# Patient Record
Sex: Female | Born: 1990 | Race: Black or African American | Hispanic: No | Marital: Single | Smoking: Never smoker
Health system: Southern US, Community
[De-identification: ages and names within clinical notes are randomized; demographics above are authoritative.]

## PROBLEM LIST (undated history)

## (undated) ENCOUNTER — Inpatient Hospital Stay (HOSPITAL_COMMUNITY): Payer: Self-pay

---

## 2016-12-24 ENCOUNTER — Emergency Department (HOSPITAL_BASED_OUTPATIENT_CLINIC_OR_DEPARTMENT_OTHER)
Admission: EM | Admit: 2016-12-24 | Discharge: 2016-12-24 | Disposition: A | Discharge status: Home / Self Care | Payer: BLUE CROSS/BLUE SHIELD | Attending: Emergency Medicine | Admitting: Emergency Medicine

## 2016-12-24 ENCOUNTER — Encounter (HOSPITAL_BASED_OUTPATIENT_CLINIC_OR_DEPARTMENT_OTHER): Payer: Self-pay | Admitting: Emergency Medicine

## 2016-12-24 DIAGNOSIS — J209 Acute bronchitis, unspecified: Secondary | ICD-10-CM

## 2016-12-24 DIAGNOSIS — R05 Cough: Secondary | ICD-10-CM | POA: Diagnosis present

## 2016-12-24 MED ORDER — ALBUTEROL SULFATE HFA 108 (90 BASE) MCG/ACT IN AERS: 2.0000 | INHALATION_SPRAY | RESPIRATORY_TRACT | 0 refills | Status: DC | PRN

## 2016-12-24 MED ORDER — AZITHROMYCIN 250 MG PO TABS: 250.0000 mg | ORAL_TABLET | Freq: Every day | ORAL | 0 refills | Status: DC

## 2016-12-24 MED FILL — VENTOLIN HFA 90 MCG INHALER: 108 (90 BAS | 17 days supply | Qty: 18 | Fill #0

## 2016-12-24 MED FILL — AZITHROMYCIN 250 MG TABLET: 250 | 5 days supply | Qty: 6 | Fill #0

## 2016-12-24 NOTE — ED Triage Notes (Signed)
Patient states she has been feeling bad x 2 weeks. States work told her she had to come due to cough.  Reports cough as nonproductive, reports shortness of breath on exertion.

## 2016-12-24 NOTE — ED Provider Notes (Signed)
[facility]   CSN: [id number] Arrival date & time: 12/24/16  4098     History   Chief Complaint Chief Complaint  Patient presents with  . Cough    HPI Joann Rivera is a 26 y.o. female.  The history is provided by the patient. No language interpreter was used.  Cough  This is a new problem. The problem occurs constantly. The problem has been gradually worsening. The cough is productive of sputum. There has been no fever. Pertinent negatives include no shortness of breath. She has tried nothing for the symptoms. The treatment provided no relief. She is not a smoker.  Pt complains of a cough and congestion.    History reviewed. No pertinent past medical history.  There are no active problems to display for this patient.   History reviewed. No pertinent surgical history.  OB History    No data available       Home Medications    Prior to Admission medications   Medication Sig Start Date End Date Taking? Authorizing Provider  albuterol (PROVENTIL HFA;VENTOLIN HFA) 108 (90 Base) MCG/ACT inhaler Inhale 2 puffs into the lungs every 4 (four) hours as needed for wheezing or shortness of breath. 12/24/16   Elson Areas, PA-C  azithromycin (ZITHROMAX) 250 MG tablet Take 1 tablet (250 mg total) by mouth daily. Take first 2 tablets together, then 1 every day until finished. 12/24/16   Elson Areas, PA-C    Family History History reviewed. No pertinent family history.  Social History Social History  Substance Use Topics  . Smoking status: Never Smoker  . Smokeless tobacco: Never Used  . Alcohol use Yes     Comment: occ     Allergies   Patient has no known allergies.   Review of Systems Review of Systems  Respiratory: Positive for cough. Negative for shortness of breath.   All other systems reviewed and are negative.    Physical Exam Updated Vital Signs Pulse 88   Temp 98.2 F (36.8 C) (Oral)   Resp 16   Ht 5\' 9"  (1.753 m)   Wt  79.4 kg   LMP 12/24/2016 (Exact Date)   SpO2 99%   BMI 25.84 kg/m   Physical Exam  Constitutional: She is oriented to person, place, and time. She appears well-developed and well-nourished.  HENT:  Head: Normocephalic.  Eyes: EOM are normal.  Neck: Normal range of motion.  Cardiovascular: Normal rate and regular rhythm.   Pulmonary/Chest: Effort normal and breath sounds normal.  Abdominal: Soft. She exhibits no distension.  Musculoskeletal: Normal range of motion.  Neurological: She is alert and oriented to person, place, and time.  Psychiatric: She has a normal mood and affect.  Nursing note and vitals reviewed.    ED Treatments / Results  Labs (all labs ordered are listed, but only abnormal results are displayed) Labs Reviewed - No data to display  EKG  EKG Interpretation None       Radiology No results found.  Procedures Procedures (including critical care time)  Medications Ordered in ED Medications - No data to display  Pt declined xray  Initial Impression / Assessment and Plan / ED Course  I have reviewed the triage vital signs and the nursing notes.  Pertinent labs & imaging results that were available during my care of the patient were reviewed by me and considered in my medical decision making (see chart for details).  Clinical Course  Final Clinical Impressions(s) / ED Diagnoses   Final diagnoses:  Acute bronchitis, unspecified organism    New Prescriptions Discharge Medication List as of 12/24/2016 10:42 AM    START taking these medications   Details  albuterol (PROVENTIL HFA;VENTOLIN HFA) 108 (90 Base) MCG/ACT inhaler Inhale 2 puffs into the lungs every 4 (four) hours as needed for wheezing or shortness of breath., Starting Mon 12/24/2016, Print    azithromycin (ZITHROMAX) 250 MG tablet Take 1 tablet (250 mg total) by mouth daily. Take first 2 tablets together, then 1 every day until finished., Starting Mon 12/24/2016, Print      An  After Visit Summary was printed and given to the patient.   Lonia SkinnerLeslie K ErhardSofia, PA-C 12/24/16 1519    Rolan BuccoMelanie Belfi, MD 12/24/16 1520

## 2016-12-24 NOTE — Discharge Instructions (Signed)
Return if any problems.  Recheck if not improving in 3-4 days

## 2017-06-10 ENCOUNTER — Encounter (HOSPITAL_BASED_OUTPATIENT_CLINIC_OR_DEPARTMENT_OTHER): Payer: Self-pay | Admitting: *Deleted

## 2017-06-10 ENCOUNTER — Emergency Department (HOSPITAL_BASED_OUTPATIENT_CLINIC_OR_DEPARTMENT_OTHER): Payer: BLUE CROSS/BLUE SHIELD

## 2017-06-10 ENCOUNTER — Emergency Department (HOSPITAL_BASED_OUTPATIENT_CLINIC_OR_DEPARTMENT_OTHER)
Admission: EM | Admit: 2017-06-10 | Discharge: 2017-06-10 | Disposition: A | Discharge status: Home / Self Care | Payer: BLUE CROSS/BLUE SHIELD | Attending: Emergency Medicine | Admitting: Emergency Medicine

## 2017-06-10 DIAGNOSIS — M79661 Pain in right lower leg: Secondary | ICD-10-CM | POA: Diagnosis not present

## 2017-06-10 DIAGNOSIS — R0789 Other chest pain: Secondary | ICD-10-CM | POA: Insufficient documentation

## 2017-06-10 DIAGNOSIS — M79604 Pain in right leg: Secondary | ICD-10-CM

## 2017-06-10 DIAGNOSIS — R609 Edema, unspecified: Secondary | ICD-10-CM | POA: Diagnosis not present

## 2017-06-10 LAB — TROPONIN I: Troponin I: <0.03 ng/mL (ref <0.03)

## 2017-06-10 LAB — PREGNANCY, URINE: PREG TEST UR: NEGATIVE

## 2017-06-10 LAB — D-DIMER, QUANTITATIVE: D-Dimer, Quant: <0.27 ug/mL-FEU (ref 0.00–0.50)

## 2017-06-10 LAB — BASIC METABOLIC PANEL
Anion gap: 8 (ref 5–15)
BUN: 10 mg/dL (ref 6–20)
CALCIUM: 8.9 mg/dL (ref 8.9–10.3)
CO2: 25 mmol/L (ref 22–32)
CREATININE: 0.76 mg/dL (ref 0.44–1.00)
Chloride: 103 mmol/L (ref 101–111)
GFR calc non Af Amer: >60 mL/min (ref >60)
GLUCOSE: 93 mg/dL (ref 65–99)
Potassium: 3.6 mmol/L (ref 3.5–5.1)
Sodium: 136 mmol/L (ref 135–145)

## 2017-06-10 LAB — CBC
HEMATOCRIT: 35.7 % — AB (ref 36.0–46.0)
Hemoglobin: 12.2 g/dL (ref 12.0–15.0)
MCH: 30 pg (ref 26.0–34.0)
MCHC: 34.2 g/dL (ref 30.0–36.0)
MCV: 87.7 fL (ref 78.0–100.0)
Platelets: 306 10*3/uL (ref 150–400)
RBC: 4.07 MIL/uL (ref 3.87–5.11)
RDW: 13.3 % (ref 11.5–15.5)
WBC: 5.8 10*3/uL (ref 4.0–10.5)

## 2017-06-10 NOTE — Discharge Instructions (Signed)
Return to ER if any problems breathing or concerns for infection.

## 2017-06-10 NOTE — ED Notes (Signed)
ED Provider at bedside. 

## 2017-06-10 NOTE — ED Notes (Addendum)
Patient stated that she felt her right leg is tight.  She also complaint of stabbing to right & left chest pain.  She felt like the food is coming back up after she ate.  Right leg is slightly swollen and warm compared to her left leg.

## 2017-06-10 NOTE — ED Triage Notes (Signed)
Right leg pain x 2 weeks. Leg is swollen.

## 2017-06-10 NOTE — ED Provider Notes (Signed)
[facility]   CSN: [id number] Arrival date & time: 06/10/17  1340     History   Chief Complaint Chief Complaint  Patient presents with  . Leg Pain    HPI Joann Rivera is a 26 y.o. female.  26yo F who p/w right leg pain. PT reports 2 weeks of R lower leg pain and tightness feeling, no h/o trauma or injury. She does report car ride to [place] 2 weeks ago, states that her pain was very mild at the start of the car ride but has been worse since getting back from the trip. She also reports 1 month of intermittent, random episodes of sharp, non-radiating left and right chest pain that is not associated with exertion. She occasionally feels short of breath but no exertional shortness of breath. No vomiting, diarrhea, fever, cough/cold symptoms, or recent illness. No OCP use, history of cancer, or history of blood clots. She does note that her mom has history of blood clots.   The history is provided by the patient.  Leg Pain      History reviewed. No pertinent past medical history.  There are no active problems to display for this patient.   History reviewed. No pertinent surgical history.  OB History    No data available       Home Medications    Prior to Admission medications   Medication Sig Start Date End Date Taking? Authorizing Provider  albuterol (PROVENTIL HFA;VENTOLIN HFA) 108 (90 Base) MCG/ACT inhaler Inhale 2 puffs into the lungs every 4 (four) hours as needed for wheezing or shortness of breath. 12/24/16   Elson Areas, PA-C  azithromycin (ZITHROMAX) 250 MG tablet Take 1 tablet (250 mg total) by mouth daily. Take first 2 tablets together, then 1 every day until finished. 12/24/16   Elson Areas, PA-C    Family History No family history on file.  Social History Social History  Substance Use Topics  . Smoking status: Never Smoker  . Smokeless tobacco: Never Used  . Alcohol use Yes     Comment: occ     Allergies   Patient  has no known allergies.   Review of Systems Review of Systems All other systems reviewed and are negative except that which was mentioned in HPI   Physical Exam Updated Vital Signs BP 119/87 (BP Location: Left Arm)   Pulse 83   Temp 98.6 F (37 C) (Oral)   Resp 16   Ht 5\' 9"  (1.753 m)   Wt 81.6 kg (180 lb)   LMP 05/16/2017   SpO2 100%   BMI 26.58 kg/m   Physical Exam  Constitutional: She is oriented to person, place, and time. She appears well-developed and well-nourished. No distress.  HENT:  Head: Normocephalic and atraumatic.  Mouth/Throat: Oropharynx is clear and moist.  Moist mucous membranes  Eyes: Conjunctivae are normal. Pupils are equal, round, and reactive to light.  Neck: Neck supple.  Cardiovascular: Normal rate, regular rhythm, normal heart sounds and intact distal pulses.   No murmur heard. Pulmonary/Chest: Effort normal and breath sounds normal.  Abdominal: Soft. Bowel sounds are normal. She exhibits no distension. There is no tenderness.  Musculoskeletal: She exhibits no edema or tenderness.  No tenderness of posterior right leg, no appreciable swelling, redness, or warmth of right leg compared to left.  Neurological: She is alert and oriented to person, place, and time.  Fluent speech  Skin: Skin is warm and dry. No erythema.  Psychiatric: She  has a normal mood and affect. Judgment normal.  Nursing note and vitals reviewed.    ED Treatments / Results  Labs (all labs ordered are listed, but only abnormal results are displayed) Labs Reviewed  CBC - Abnormal; Notable for the following:       Result Value   HCT 35.7 (*)    All other components within normal limits  BASIC METABOLIC PANEL  TROPONIN I  PREGNANCY, URINE  D-DIMER, QUANTITATIVE (NOT AT [facility])    EKG  EKG Interpretation  Date/Time:  Monday June 10 2017 15:47:22 EDT Ventricular Rate:  83 PR Interval:    QRS Duration: 84 QT Interval:  389 QTC Calculation: 458 R Axis:   40 Text  Interpretation:  Sinus rhythm Low voltage, precordial leads No previous ECGs available Confirmed by Frederick Peers [phone]) on 06/10/2017 3:50:42 PM Also confirmed by Frederick Peers [phone]), editor Misty Stanley [phone])  on 06/10/2017 3:51:27 PM       Radiology Dg Chest 2 View  Result Date: 06/10/2017 CLINICAL DATA:  Right lower extremity pain and swelling. EXAM: CHEST  2 VIEW COMPARISON:  No recent prior . FINDINGS: Mediastinum hilar structures normal. Lungs are clear. Heart size normal. No pleural effusion or pneumothorax . IMPRESSION: No acute cardiopulmonary disease. Electronically Signed   By: Maisie Fus  Register   On: 06/10/2017 16:04   US Venous Img Lower Unilateral Right  Result Date: 06/10/2017 CLINICAL DATA:  Pain and swelling of RIGHT calf for 2 weeks EXAM: RIGHT LOWER EXTREMITY VENOUS DOPPLER ULTRASOUND TECHNIQUE: Gray-scale sonography with graded compression, as well as color Doppler and duplex ultrasound were performed to evaluate the lower extremity deep venous systems from the level of the common femoral vein and including the common femoral, femoral, profunda femoral, popliteal and calf veins including the posterior tibial, peroneal and gastrocnemius veins when visible. The superficial great saphenous vein was also interrogated. Spectral Doppler was utilized to evaluate flow at rest and with distal augmentation maneuvers in the common femoral, femoral and popliteal veins. COMPARISON:  None FINDINGS: Contralateral Common Femoral Vein: Respiratory phasicity is normal and symmetric with the symptomatic side. No evidence of thrombus. Normal compressibility. Common Femoral Vein: No evidence of thrombus. Normal compressibility, respiratory phasicity and response to augmentation. Saphenofemoral Junction: No evidence of thrombus. Normal compressibility and flow on color Doppler imaging. Profunda Femoral Vein: No evidence of thrombus. Normal compressibility and flow on color Doppler imaging.  Femoral Vein: No evidence of thrombus. Normal compressibility, respiratory phasicity and response to augmentation. Popliteal Vein: No evidence of thrombus. Normal compressibility, respiratory phasicity and response to augmentation. Calf Veins: No evidence of thrombus. Normal compressibility and flow on color Doppler imaging. Superficial Great Saphenous Vein: No evidence of thrombus. Normal compressibility and flow on color Doppler imaging. Venous Reflux:  None. Other Findings:  None. IMPRESSION: No evidence of DVT within the RIGHT lower extremity. Electronically Signed   By: Ulyses Southward M.D.   On: 06/10/2017 15:51    Procedures Procedures (including critical care time)  Medications Ordered in ED Medications - No data to display   Initial Impression / Assessment and Plan / ED Course  I have reviewed the triage vital signs and the nursing notes.  Pertinent labs & imaging results that were available during my care of the patient were reviewed by me and considered in my medical decision making (see chart for details).     Pt w/ 2 weeks of R leg pain and 1 month of intermittent CP without associated sx. She was  comfortable on exam w/ reassuring VS. O2 100% on RA and clear breath sounds. EKG reassuring. I did not appreciate any significant swelling or asymmetry of the right leg compared to left and she had no tenderness to palpation. DVT ultrasound negative. I did obtain labs including d-dimer because of the patient's report of chest pain and recent travel. All lab work including d-dimer and troponin were negative. Chest x-ray also normal. Given her young age and health, I feel that ACS is very unlikely. I've discussed supportive measures and follow-up with PCP in 5-7 days for reevaluation and consideration of repeat ultrasound if her symptoms persist or worsen in her leg. Reviewed return precautions regarding breathing problems, signs of infection, or other acute changes. Patient voiced understanding and  was discharged in satisfactory condition.  Final Clinical Impressions(s) / ED Diagnoses   Final diagnoses:  Swelling  Right leg pain  Atypical chest pain    New Prescriptions New Prescriptions   No medications on file     Chistopher Mangino, Ambrose Finlandachel Morgan, MD 06/10/17 1724

## 2017-10-03 ENCOUNTER — Emergency Department (HOSPITAL_BASED_OUTPATIENT_CLINIC_OR_DEPARTMENT_OTHER)
Admission: EM | Admit: 2017-10-03 | Discharge: 2017-10-03 | Disposition: A | Discharge status: Home / Self Care | Payer: BLUE CROSS/BLUE SHIELD | Attending: Emergency Medicine | Admitting: Emergency Medicine

## 2017-10-03 ENCOUNTER — Encounter (HOSPITAL_BASED_OUTPATIENT_CLINIC_OR_DEPARTMENT_OTHER): Payer: Self-pay | Admitting: *Deleted

## 2017-10-03 DIAGNOSIS — N760 Acute vaginitis: Secondary | ICD-10-CM | POA: Insufficient documentation

## 2017-10-03 DIAGNOSIS — R1030 Lower abdominal pain, unspecified: Secondary | ICD-10-CM | POA: Diagnosis present

## 2017-10-03 DIAGNOSIS — B9689 Other specified bacterial agents as the cause of diseases classified elsewhere: Secondary | ICD-10-CM | POA: Insufficient documentation

## 2017-10-03 LAB — URINALYSIS, ROUTINE W REFLEX MICROSCOPIC
Bilirubin Urine: NEGATIVE
Glucose, UA: NEGATIVE mg/dL
Hgb urine dipstick: NEGATIVE
Ketones, ur: NEGATIVE mg/dL
LEUKOCYTES UA: NEGATIVE
NITRITE: NEGATIVE
PROTEIN: NEGATIVE mg/dL
SPECIFIC GRAVITY, URINE: 1.015 (ref 1.005–1.030)
pH: 7 (ref 5.0–8.0)

## 2017-10-03 LAB — WET PREP, GENITAL
SPERM: NONE SEEN
TRICH WET PREP: NONE SEEN
YEAST WET PREP: NONE SEEN

## 2017-10-03 LAB — PREGNANCY, URINE: Preg Test, Ur: NEGATIVE

## 2017-10-03 MED ORDER — FLUCONAZOLE 150 MG PO TABS: 150.0000 mg | ORAL_TABLET | Freq: Every day | ORAL | 0 refills | Status: AC

## 2017-10-03 MED ORDER — METRONIDAZOLE 500 MG PO TABS: 500.0000 mg | ORAL_TABLET | Freq: Two times a day (BID) | ORAL | 0 refills | Status: DC

## 2017-10-03 NOTE — ED Triage Notes (Signed)
Pt c/o lower abd cramping x 2 weeks. Missed period x 2 weeks

## 2017-10-03 NOTE — ED Notes (Signed)
ED Provider at bedside. 

## 2017-10-03 NOTE — Discharge Instructions (Signed)

## 2017-10-03 NOTE — ED Provider Notes (Signed)
Emergency Department Provider Note   I have reviewed the triage vital signs and the nursing notes.   HISTORY  Chief Complaint Abdominal Cramping   HPI Joann Rivera is a 26 y.o. female presents to the emergency department for evaluation of lower abdominal cramping for the past 2 weeks with associated missed period.  The patient describes a constant, cramping pain that is mild to moderate in intensity.  She is tried Motrin with no relief in symptoms.  She denies any vaginal bleeding or discharge. No dysuria, hesitancy, urgency. She does not take oral contraception pills. She is sexually active. No radiation of pain.  No fevers or chills.  No chest pain or difficulty breathing. She notes some mild nausea but no vomiting or diarrhea.    History reviewed. No pertinent past medical history.  There are no active problems to display for this patient.   History reviewed. No pertinent surgical history.    Allergies Patient has no known allergies.  History reviewed. No pertinent family history.  Social History Social History  Substance Use Topics  . Smoking status: Never Smoker  . Smokeless tobacco: Never Used  . Alcohol use Yes     Comment: occ    Review of Systems  Constitutional: No fever/chills Eyes: No visual changes. ENT: No sore throat. Cardiovascular: Denies chest pain. Respiratory: Denies shortness of breath. Gastrointestinal: Positive cramping lower abdominal pain. Positive nausea, no vomiting.  No diarrhea.  No constipation. Genitourinary: Negative for dysuria. Musculoskeletal: Negative for back pain. Skin: Negative for rash. Neurological: Negative for headaches, focal weakness or numbness.  10-point ROS otherwise negative.  ____________________________________________   PHYSICAL EXAM:  VITAL SIGNS: ED Triage Vitals  Enc Vitals Group     BP 10/03/17 1437 133/89     Pulse Rate 10/03/17 1437 81     Resp 10/03/17 1437 16     Temp 10/03/17 1439 98.3 F  (36.8 C)     Temp src --      SpO2 10/03/17 1437 100 %     Weight 10/03/17 1438 190 lb (86.2 kg)     Height 10/03/17 1438 5\' 9"  (1.753 m)     Pain Score 10/03/17 1437 7   Constitutional: Alert and oriented. Well appearing and in no acute distress. Eyes: Conjunctivae are normal.  Head: Atraumatic. Nose: No congestion/rhinnorhea. Mouth/Throat: Mucous membranes are moist.  Neck: No stridor.  Cardiovascular: Normal rate, regular rhythm. Good peripheral circulation. Grossly normal heart sounds.   Respiratory: Normal respiratory effort.  No retractions. Lungs CTAB. Gastrointestinal: Soft and nontender. No distention. Normal external genitalia. No CMT. No adnexal tenderness or fullness.  Musculoskeletal: No lower extremity tenderness nor edema. No gross deformities of extremities. Neurologic:  Normal speech and language. No gross focal neurologic deficits are appreciated.  Skin:  Skin is warm, dry and intact. No rash noted.  ____________________________________________   LABS (all labs ordered are listed, but only abnormal results are displayed)  Labs Reviewed  WET PREP, GENITAL - Abnormal; Notable for the following:       Result Value   Clue Cells Wet Prep HPF POC PRESENT (*)    WBC, Wet Prep HPF POC MODERATE (*)    All other components within normal limits  PREGNANCY, URINE  URINALYSIS, ROUTINE W REFLEX MICROSCOPIC  GC/CHLAMYDIA PROBE AMP ([place]   ____________________________________________  RADIOLOGY  No results found.  ____________________________________________   PROCEDURES  Procedure(s) performed:   Procedures  None ____________________________________________   INITIAL IMPRESSION / ASSESSMENT  AND PLAN / ED COURSE  Pertinent labs & imaging results that were available during my care of the patient were reviewed by me and considered in my medical decision making (see chart for details).  Patient presents to the ED for evaluation of lower  abdominal cramping pain with no menstrual period in the last 2 weeks. Pregnancy test is negative as is UA. Plan to follow wet prep after pelvic.   03:50 PM Wet prep shows evidence of bacterial vaginosis.  Plan to treat this with Flagyl.  Also gave one-time dose of fluconazole and discussed symptoms of yeast infection.  The patient can take this if she develops a yeast infection after taking antibiotics.  I advised that she follow with OB/GYN and provided contact information for the specialist.  At this time, I do not feel there is any life-threatening condition present. I have reviewed and discussed all results (EKG, imaging, lab, urine as appropriate), exam findings with patient. I have reviewed nursing notes and appropriate previous records.  I feel the patient is safe to be discharged home without further emergent workup. Discussed usual and customary return precautions. Patient and family (if present) verbalize understanding and are comfortable with this plan.  Patient will follow-up with their primary care provider. If they do not have a primary care provider, information for follow-up has been provided to them. All questions have been answered.  ____________________________________________  FINAL CLINICAL IMPRESSION(S) / ED DIAGNOSES  Final diagnoses:  Lower abdominal pain  BV (bacterial vaginosis)     MEDICATIONS GIVEN DURING THIS VISIT:  Medications - No data to display   NEW OUTPATIENT MEDICATIONS STARTED DURING THIS VISIT:  New Prescriptions   FLUCONAZOLE (DIFLUCAN) 150 MG TABLET    Take 1 tablet (150 mg total) by mouth daily.   METRONIDAZOLE (FLAGYL) 500 MG TABLET    Take 1 tablet (500 mg total) by mouth 2 (two) times daily.    Note:  This document was prepared using Dragon voice recognition software and may include unintentional dictation errors.  Alona Bene, MD Emergency Medicine    Javione Gunawan, Arlyss Repress, MD 10/03/17 [phone]

## 2017-10-03 NOTE — ED Notes (Signed)
Pt dc'd by another RN at 1600.

## 2017-10-04 LAB — GC/CHLAMYDIA PROBE AMP (~~LOC~~) NOT AT ARMC
CHLAMYDIA, DNA PROBE: NEGATIVE
NEISSERIA GONORRHEA: NEGATIVE

## 2017-12-04 ENCOUNTER — Ambulatory Visit (INDEPENDENT_AMBULATORY_CARE_PROVIDER_SITE_OTHER): Payer: BLUE CROSS/BLUE SHIELD | Admitting: Family Medicine

## 2017-12-04 ENCOUNTER — Encounter: Payer: Self-pay | Admitting: Family Medicine

## 2017-12-04 VITALS — BP 139/80 | HR 97 | Wt 196.0 lb

## 2017-12-04 DIAGNOSIS — Z124 Encounter for screening for malignant neoplasm of cervix: Secondary | ICD-10-CM

## 2017-12-04 DIAGNOSIS — N911 Secondary amenorrhea: Secondary | ICD-10-CM | POA: Diagnosis not present

## 2017-12-04 MED ORDER — MEDROXYPROGESTERONE ACETATE 10 MG PO TABS: 10.0000 mg | ORAL_TABLET | Freq: Every day | ORAL | 0 refills | Status: AC

## 2017-12-04 NOTE — Progress Notes (Signed)
Patient reported she has not had period for 3 months.Patient complaining of cramping as well. Joann StammerJennifer Keona Bilyeu RNBSN

## 2017-12-04 NOTE — Progress Notes (Signed)
   Subjective:    Patient ID: Joann Rivera, female    DOB: 11/07/1991, 26 y.o.   MRN: [id number]  HPI Patient seen for secondary amenorrhea.  Patient has long-standing history of normal periods that started when she was a teenager.  Normal cycles had a normal interval of 28-30 days and would last for 5-7 days with 3 days of heavy flow.  She has never skipped periods and has never been pregnant.  In October, patient stopped having periods but has not had any bleeding since that time.  She was evaluated 2 weeks ago by primary care physician.  TSH, FSH, estradiol were normal, but the patient had a minimally elevated prolactin at 16-17.  She denies galactorrhea.  She denies previous uterine or cervical procedures.  She does have about a 10 pound weight gain since October.  She also admits that her breasts  have increased in size by about a cup size with some mild tenderness.  She is not on any medications and does not take any illicit drugs.  No other medical problems.  I have reviewed the patients past medical, family, and social history.  I have reviewed the patient's medication list and allergies.   Review of Systems  All other systems reviewed and are negative.      Objective:   Physical Exam  Constitutional: She is oriented to person, place, and time. She appears well-developed and well-nourished.  HENT:  Head: Normocephalic and atraumatic.  Right Ear: External ear normal.  Left Ear: External ear normal.  Eyes: Conjunctivae are normal. Pupils are equal, round, and reactive to light.  Neck: Normal range of motion. Neck supple.  Cardiovascular: Normal rate, regular rhythm and normal heart sounds.  Pulmonary/Chest: Effort normal and breath sounds normal. No respiratory distress. She has no wheezes. She has no rales. She exhibits no tenderness.  Abdominal: Soft. Bowel sounds are normal. She exhibits no distension and no mass. There is no tenderness. There is no rebound and no guarding. Hernia  confirmed negative in the right inguinal area and confirmed negative in the left inguinal area.  Genitourinary: There is no rash, tenderness, lesion or injury on the right labia. There is no rash, tenderness, lesion or injury on the left labia. Uterus is not deviated, not enlarged, not fixed and not tender. Cervix exhibits no motion tenderness, no discharge and no friability. Right adnexum displays no mass, no tenderness and no fullness. Left adnexum displays no mass, no tenderness and no fullness. No erythema or bleeding in the vagina. No foreign body in the vagina. No signs of injury around the vagina. No vaginal discharge found.  Lymphadenopathy:       Right: No inguinal adenopathy present.       Left: No inguinal adenopathy present.  Neurological: She is alert and oriented to person, place, and time.  Skin: Skin is warm and dry.  Psychiatric: She has a normal mood and affect. Her behavior is normal. Judgment and thought content normal.      Assessment & Plan:  1. Secondary amenorrhea Prolactin minimally elevated. I do not think she needs an MRI at this point. Will repeat prolactin, check labs for hyperandrogenism, HgA1c.  We will give patient Provera challenge.  This was discussed in detail with the patient: Provera 10 mg x 10 days, then stop.  Will have patient follow-up in approximately 6 weeks - DHEA-sulfate - Prolactin - TestT+TestF+SHBG - Hemoglobin A1c   2. Screening for cervical cancer - Cytology - PAP

## 2017-12-05 LAB — CYTOLOGY - PAP
BACTERIAL VAGINITIS: POSITIVE — AB
CANDIDA VAGINITIS: POSITIVE — AB
Diagnosis: NEGATIVE

## 2017-12-07 LAB — TESTT+TESTF+SHBG
SEX HORMONE BINDING: 81 nmol/L (ref 24.6–122.0)
TESTOSTERONE FREE: 1.1 pg/mL (ref 0.0–4.2)
TESTOSTERONE, TOTAL: 40.3 ng/dL (ref 10.0–55.0)

## 2017-12-07 LAB — HEMOGLOBIN A1C
Est. average glucose Bld gHb Est-mCnc: 103 mg/dL
Hgb A1c MFr Bld: 5.2 % (ref 4.8–5.6)

## 2017-12-07 LAB — DHEA-SULFATE: DHEA SO4: 301.8 ug/dL (ref 84.8–378.0)

## 2017-12-07 LAB — PROLACTIN: PROLACTIN: 17.9 ng/mL (ref 4.8–23.3)

## 2017-12-12 ENCOUNTER — Other Ambulatory Visit: Payer: Self-pay | Admitting: Family Medicine

## 2017-12-12 DIAGNOSIS — R102 Pelvic and perineal pain: Secondary | ICD-10-CM

## 2017-12-12 MED ORDER — METRONIDAZOLE 500 MG PO TABS: 500.0000 mg | ORAL_TABLET | Freq: Two times a day (BID) | ORAL | 0 refills | Status: DC

## 2017-12-12 MED ORDER — METRONIDAZOLE 500 MG PO TABS: 500.0000 mg | ORAL_TABLET | Freq: Two times a day (BID) | ORAL | 0 refills | Status: AC

## 2017-12-12 MED ORDER — FLUCONAZOLE 150 MG PO TABS: 150.0000 mg | ORAL_TABLET | Freq: Once | ORAL | 0 refills | Status: AC

## 2017-12-12 NOTE — Progress Notes (Signed)
Called patient regarding lab results from Amenorrhea work up. Labs normal. Patient started menses a couple days ago. Never took provera. Follow up as needed.  She is having pelvic pain that started shortly after last visit. Still having pain. Asked for US to r/o cysts. Will order.  PAP normal - has BV and yeast. Medication prescribed. Pt informed.

## 2017-12-14 ENCOUNTER — Ambulatory Visit (HOSPITAL_BASED_OUTPATIENT_CLINIC_OR_DEPARTMENT_OTHER): Payer: BLUE CROSS/BLUE SHIELD

## 2017-12-17 ENCOUNTER — Ambulatory Visit (HOSPITAL_BASED_OUTPATIENT_CLINIC_OR_DEPARTMENT_OTHER): Payer: BLUE CROSS/BLUE SHIELD

## 2017-12-26 ENCOUNTER — Encounter: Payer: BLUE CROSS/BLUE SHIELD | Admitting: Obstetrics & Gynecology

## 2019-04-17 ENCOUNTER — Encounter (HOSPITAL_BASED_OUTPATIENT_CLINIC_OR_DEPARTMENT_OTHER): Payer: Self-pay | Admitting: Emergency Medicine

## 2019-04-17 ENCOUNTER — Emergency Department (HOSPITAL_BASED_OUTPATIENT_CLINIC_OR_DEPARTMENT_OTHER)
Admission: EM | Admit: 2019-04-17 | Discharge: 2019-04-17 | Disposition: A | Discharge status: Home / Self Care | Payer: BLUE CROSS/BLUE SHIELD | Attending: Emergency Medicine | Admitting: Emergency Medicine

## 2019-04-17 ENCOUNTER — Other Ambulatory Visit: Payer: Self-pay

## 2019-04-17 DIAGNOSIS — K029 Dental caries, unspecified: Secondary | ICD-10-CM | POA: Insufficient documentation

## 2019-04-17 DIAGNOSIS — Z0279 Encounter for issue of other medical certificate: Secondary | ICD-10-CM | POA: Insufficient documentation

## 2019-04-17 DIAGNOSIS — K0889 Other specified disorders of teeth and supporting structures: Secondary | ICD-10-CM | POA: Insufficient documentation

## 2019-04-17 DIAGNOSIS — Z79899 Other long term (current) drug therapy: Secondary | ICD-10-CM | POA: Insufficient documentation

## 2019-04-17 MED ORDER — AMOXICILLIN-POT CLAVULANATE 875-125 MG PO TABS: 1.0000 | ORAL_TABLET | Freq: Two times a day (BID) | ORAL | 0 refills | Status: AC

## 2019-04-17 NOTE — ED Provider Notes (Signed)
[facility] EMERGENCY DEPARTMENT Provider Note   CSN: [id number] Arrival date & time: 04/17/19  4709    History   Chief Complaint Chief Complaint  Patient presents with  . needs work note  . Dental Pain    HPI Joann Rivera is a 28 y.o. female.     HPI   This 28 year old female presents with desire for clearance to return to work.  Reports she has a history of dental pain, and will at times have worsening of her dental pain with associated chills, and reports that when she reported to work this morning she had looked unwell and they sent her to the emergency department for evaluation.  Reports that the symptoms she is having now are consistent with the symptoms she is had multiple other times before with increasing dental pain and associated chills.  Reports that she is awaiting for insurance in order to see a dentist and have her teeth pulled.  She denies known fevers.  Denies cough, loss of taste or smell, vomiting, diarrhea.  Reports that she is menstruating and has nausea associated with that but denies any other concerns.  History reviewed. No pertinent past medical history.  There are no active problems to display for this patient.   History reviewed. No pertinent surgical history.   OB History    Gravida  0   Para  0   Term  0   Preterm  0   AB  0   Living  0     SAB  0   TAB  0   Ectopic  0   Multiple  0   Live Births  0            Home Medications    Prior to Admission medications   Medication Sig Start Date End Date Taking? Authorizing Provider  loratadine (CLARITIN) 10 MG tablet Take 10 mg by mouth daily.   Yes [provider]  amoxicillin-clavulanate (AUGMENTIN) 875-125 MG tablet Take 1 tablet by mouth every 12 (twelve) hours. 04/17/19   Alvira Monday, MD  medroxyPROGESTERone (PROVERA) 10 MG tablet Take 1 tablet (10 mg total) by mouth daily. Use for ten days 12/04/17   Levie Heritage, DO  metroNIDAZOLE (FLAGYL) 500  MG tablet Take 1 tablet (500 mg total) by mouth 2 (two) times daily. 12/12/17   Levie Heritage, DO    Family History Family History  Problem Relation Age of Onset  . Cancer Paternal Grandfather        prostate  . Diabetes Paternal Grandfather   . Cancer Mother 47       breast  . Breast cancer Mother     Social History Social History   Tobacco Use  . Smoking status: Never Smoker  . Smokeless tobacco: Never Used  Substance Use Topics  . Alcohol use: Yes    Comment: occ  . Drug use: No     Allergies   Patient has no known allergies.   Review of Systems Review of Systems  Constitutional: Positive for chills. Negative for fever.  Respiratory: Negative for cough and shortness of breath.   Cardiovascular: Negative for chest pain.  Gastrointestinal: Positive for nausea (with menses). Negative for vomiting.  Genitourinary: Negative for dysuria.     Physical Exam Updated Vital Signs BP 95/74   Pulse 62   Temp 98.4 F (36.9 C)   Resp 16   LMP 04/17/2019   SpO2 96%   Physical Exam Vitals signs and  nursing note reviewed.  Constitutional:      General: She is not in acute distress.    Appearance: She is not ill-appearing, toxic-appearing or diaphoretic.  HENT:     Head: Normocephalic and atraumatic.     Comments: Dental caries RU molar with tenderness    Nose: Nose normal.  Cardiovascular:     Rate and Rhythm: Normal rate and regular rhythm.     Pulses: Normal pulses.  Pulmonary:     Effort: Pulmonary effort is normal. No respiratory distress.  Neurological:     Mental Status: She is alert.      ED Treatments / Results  Labs (all labs ordered are listed, but only abnormal results are displayed) Labs Reviewed - No data to display  EKG None  Radiology No results found.  Procedures Procedures (including critical care time)  Medications Ordered in ED Medications - No data to display   Initial Impression / Assessment and Plan / ED Course  I have  reviewed the triage vital signs and the nursing notes.  Pertinent labs & imaging results that were available during my care of the patient were reviewed by me and considered in my medical decision making (see chart for details).        28 year old female presents with desire for clearance to return to work and dental pain.  Reports her work center here given her appearance this morning.  She denies fevers, is afebrile here, well-appearing, no cough, no shortness of breath, no loss of taste or smell, no vomiting, no diarrhea, no known COVID contacts, and have low suspicion for COVID-19.  She does report dental pain which she has had previously, and associated chills.  She has no sign of drainable abscess on exam.  Will give prescription for Augmentin, recommend Tylenol and ibuprofen for pain, and dental follow-up.  She may return to work. Patient discharged in stable condition with understanding of reasons to return.  Final Clinical Impressions(s) / ED Diagnoses   Final diagnoses:  Pain, dental    ED Discharge Orders         Ordered    amoxicillin-clavulanate (AUGMENTIN) 875-125 MG tablet  Every 12 hours     04/17/19 0714           Alvira MondaySchlossman, Kymir Coles, MD 04/17/19 [phone]

## 2019-04-17 NOTE — Discharge Instructions (Addendum)
Take Tylenol 1000 mg 4 times a day for 1 week. This is the maximum dose of Tylenol (acetaminophen) you can take from all sources. Please check other over-the-counter medications and prescriptions to ensure you are not taking other medications that contain acetaminophen.  You may also take ibuprofen 400 mg 6 times a day alternating with or at the same time as tylenol.  °

## 2019-04-17 NOTE — ED Triage Notes (Signed)
R and L dental pain for 2 months. States she tried to go to work today but was having a lot  Of pain and workplace was concerned that she may be sick. Needs a note to go back.

## 2023-11-13 ENCOUNTER — Encounter (HOSPITAL_COMMUNITY): Payer: Self-pay | Admitting: Obstetrics and Gynecology

## 2023-11-13 ENCOUNTER — Inpatient Hospital Stay (HOSPITAL_COMMUNITY)
Admission: AD | Admit: 2023-11-13 | Discharge: 2023-11-13 | Disposition: A | Discharge status: Home / Self Care | Payer: Federal, State, Local not specified - PPO | Attending: Obstetrics and Gynecology | Admitting: Obstetrics and Gynecology

## 2023-11-13 ENCOUNTER — Inpatient Hospital Stay (HOSPITAL_COMMUNITY): Payer: Federal, State, Local not specified - PPO

## 2023-11-13 DIAGNOSIS — O3680X0 Pregnancy with inconclusive fetal viability, not applicable or unspecified: Secondary | ICD-10-CM | POA: Diagnosis not present

## 2023-11-13 DIAGNOSIS — O209 Hemorrhage in early pregnancy, unspecified: Secondary | ICD-10-CM

## 2023-11-13 DIAGNOSIS — R109 Unspecified abdominal pain: Secondary | ICD-10-CM | POA: Insufficient documentation

## 2023-11-13 DIAGNOSIS — O26851 Spotting complicating pregnancy, first trimester: Secondary | ICD-10-CM | POA: Diagnosis not present

## 2023-11-13 DIAGNOSIS — Z3A08 8 weeks gestation of pregnancy: Secondary | ICD-10-CM | POA: Insufficient documentation

## 2023-11-13 DIAGNOSIS — O26891 Other specified pregnancy related conditions, first trimester: Secondary | ICD-10-CM | POA: Insufficient documentation

## 2023-11-13 LAB — URINALYSIS, ROUTINE W REFLEX MICROSCOPIC
Bilirubin Urine: NEGATIVE
Glucose, UA: NEGATIVE mg/dL
Hgb urine dipstick: NEGATIVE
Ketones, ur: NEGATIVE mg/dL
Leukocytes,Ua: NEGATIVE
Nitrite: NEGATIVE
Protein, ur: NEGATIVE mg/dL
Specific Gravity, Urine: 1.009 (ref 1.005–1.030)
pH: 5 (ref 5.0–8.0)

## 2023-11-13 LAB — CBC
HCT: 32.8 % — ABNORMAL LOW (ref 36.0–46.0)
Hemoglobin: 11 g/dL — ABNORMAL LOW (ref 12.0–15.0)
MCH: 28.5 pg (ref 26.0–34.0)
MCHC: 33.5 g/dL (ref 30.0–36.0)
MCV: 85 fL (ref 80.0–100.0)
Platelets: 302 10*3/uL (ref 150–400)
RBC: 3.86 MIL/uL — ABNORMAL LOW (ref 3.87–5.11)
RDW: 13.1 % (ref 11.5–15.5)
WBC: 7.6 10*3/uL (ref 4.0–10.5)
nRBC: 0 % (ref 0.0–0.2)

## 2023-11-13 LAB — COMPREHENSIVE METABOLIC PANEL
ALT: 15 U/L (ref 0–44)
AST: 17 U/L (ref 15–41)
Albumin: 3.5 g/dL (ref 3.5–5.0)
Alkaline Phosphatase: 35 U/L — ABNORMAL LOW (ref 38–126)
Anion gap: 8 (ref 5–15)
BUN: 6 mg/dL (ref 6–20)
CO2: 22 mmol/L (ref 22–32)
Calcium: 9.2 mg/dL (ref 8.9–10.3)
Chloride: 103 mmol/L (ref 98–111)
Creatinine, Ser: 0.71 mg/dL (ref 0.44–1.00)
GFR, Estimated: >60 mL/min (ref >60)
Glucose, Bld: 99 mg/dL (ref 70–99)
Potassium: 3.5 mmol/L (ref 3.5–5.1)
Sodium: 133 mmol/L — ABNORMAL LOW (ref 135–145)
Total Bilirubin: 0.5 mg/dL (ref <1.2)
Total Protein: 7.2 g/dL (ref 6.5–8.1)

## 2023-11-13 LAB — HCG, QUANTITATIVE, PREGNANCY: hCG, Beta Chain, Quant, S: 142204 m[IU]/mL — ABNORMAL HIGH (ref <5)

## 2023-11-13 LAB — WET PREP, GENITAL
Clue Cells Wet Prep HPF POC: NONE SEEN
Sperm: NONE SEEN
Trich, Wet Prep: NONE SEEN
WBC, Wet Prep HPF POC: <10 (ref <10)
Yeast Wet Prep HPF POC: NONE SEEN

## 2023-11-13 NOTE — MAU Provider Note (Signed)
History     CSN: [id number]  Arrival date and time: 11/13/23 1336   None     Chief Complaint  Patient presents with   Abdominal Pain   Vaginal Bleeding   Vaginal Discharge   Joann Rivera is a 32 y.o. at [redacted]w[redacted]d presents to MAU reporting: Cramping and brown discharge since last night. Reports pain is sharp to menstrual cramp in intensity. She states that she came in to day because of intense pain at work. She has not taken anything for the pain. She is feeling better regarding pain currently.   Abdominal Pain Pertinent negatives include no constipation, diarrhea, fever, frequency, nausea or vomiting.  Vaginal Bleeding The patient's primary symptoms include vaginal discharge. Associated symptoms include abdominal pain. Pertinent negatives include no chills, constipation, diarrhea, fever, flank pain, frequency, nausea, urgency or vomiting.  Vaginal Discharge The patient's primary symptoms include vaginal discharge. Associated symptoms include abdominal pain. Pertinent negatives include no chills, constipation, diarrhea, fever, flank pain, frequency, nausea, urgency or vomiting.    OB History     Gravida  1   Para  0   Term  0   Preterm  0   AB  0   Living  0      SAB  0   IAB  0   Ectopic  0   Multiple  0   Live Births  0           No past medical history on file.  No past surgical history on file.  Family History  Problem Relation Age of Onset   Cancer Paternal Grandfather        prostate   Diabetes Paternal Grandfather    Cancer Mother 83       breast   Breast cancer Mother     Social History   Tobacco Use   Smoking status: Never   Smokeless tobacco: Never  Substance Use Topics   Alcohol use: Yes    Comment: occ   Drug use: No    Allergies: No Known Allergies  Medications Prior to Admission  Medication Sig Dispense Refill Last Dose   Prenatal Vit-Fe Fumarate-FA (PRENATAL MULTIVITAMIN) TABS tablet Take 1 tablet by mouth daily at 12  noon.   11/12/2023   amoxicillin-clavulanate (AUGMENTIN) 875-125 MG tablet Take 1 tablet by mouth every 12 (twelve) hours. 14 tablet 0    loratadine (CLARITIN) 10 MG tablet Take 10 mg by mouth daily.      medroxyPROGESTERone (PROVERA) 10 MG tablet Take 1 tablet (10 mg total) by mouth daily. Use for ten days 10 tablet 0    metroNIDAZOLE (FLAGYL) 500 MG tablet Take 1 tablet (500 mg total) by mouth 2 (two) times daily. 14 tablet 0     Review of Systems  Constitutional:  Negative for chills, fatigue, fever and unexpected weight change.  Respiratory:  Negative for cough and shortness of breath.   Cardiovascular:  Negative for chest pain and palpitations.  Gastrointestinal:  Positive for abdominal pain. Negative for constipation, diarrhea, nausea and vomiting.  Genitourinary:  Positive for vaginal bleeding and vaginal discharge. Negative for difficulty urinating, flank pain, frequency and urgency.   Physical Exam   Blood pressure 118/67, pulse 91, temperature 99.1 F (37.3 C), temperature source Oral, resp. rate 14, height 5\' 9"  (1.753 m), weight 95.3 kg, last menstrual period 09/12/2023, SpO2 100%.  Physical Exam Vitals reviewed.  Constitutional:      Appearance: Normal appearance.  HENT:     Head:  Normocephalic.  Cardiovascular:     Rate and Rhythm: Normal rate.     Pulses: Normal pulses.  Pulmonary:     Effort: Pulmonary effort is normal.  Skin:    General: Skin is warm and dry.     Capillary Refill: Capillary refill takes less than 2 seconds.  Neurological:     Mental Status: She is alert and oriented to person, place, and time.  Psychiatric:        Mood and Affect: Mood normal.        Behavior: Behavior normal.        Thought Content: Thought content normal.        Judgment: Judgment normal.     Fetal Assessment 171 bpm,  on ultrasound US WNL   MAU Course   Results for orders placed or performed during the hospital encounter of 11/13/23 (from the past 24 hour(s))   Urinalysis, Routine w reflex microscopic -Urine, Clean Catch     Status: None   Collection Time: 11/13/23  3:00 PM  Result Value Ref Range   Color, Urine YELLOW YELLOW   APPearance CLEAR CLEAR   Specific Gravity, Urine 1.009 1.005 - 1.030   pH 5.0 5.0 - 8.0   Glucose, UA NEGATIVE NEGATIVE mg/dL   Hgb urine dipstick NEGATIVE NEGATIVE   Bilirubin Urine NEGATIVE NEGATIVE   Ketones, ur NEGATIVE NEGATIVE mg/dL   Protein, ur NEGATIVE NEGATIVE mg/dL   Nitrite NEGATIVE NEGATIVE   Leukocytes,Ua NEGATIVE NEGATIVE  ABO/Rh     Status: None   Collection Time: 11/13/23  3:07 PM  Result Value Ref Range   ABO/RH(D)      A POS Performed at [facility], [address], [place], [place] [address]   CBC     Status: Abnormal   Collection Time: 11/13/23  3:10 PM  Result Value Ref Range   WBC 7.6 4.0 - 10.5 K/uL   RBC 3.86 (L) 3.87 - 5.11 MIL/uL   Hemoglobin 11.0 (L) 12.0 - 15.0 g/dL   HCT 60.4 (L) 54.0 - 98.1 %   MCV 85.0 80.0 - 100.0 fL   MCH 28.5 26.0 - 34.0 pg   MCHC 33.5 30.0 - 36.0 g/dL   RDW 19.1 47.8 - 29.5 %   Platelets 302 150 - 400 K/uL   nRBC 0.0 0.0 - 0.2 %  hCG, quantitative, pregnancy     Status: Abnormal   Collection Time: 11/13/23  3:10 PM  Result Value Ref Range   hCG, Beta Chain, Quant, S 142,204 (H) <5 mIU/mL  Comprehensive metabolic panel     Status: Abnormal   Collection Time: 11/13/23  3:10 PM  Result Value Ref Range   Sodium 133 (L) 135 - 145 mmol/L   Potassium 3.5 3.5 - 5.1 mmol/L   Chloride 103 98 - 111 mmol/L   CO2 22 22 - 32 mmol/L   Glucose, Bld 99 70 - 99 mg/dL   BUN 6 6 - 20 mg/dL   Creatinine, Ser 6.21 0.44 - 1.00 mg/dL   Calcium 9.2 8.9 - 30.8 mg/dL   Total Protein 7.2 6.5 - 8.1 g/dL   Albumin 3.5 3.5 - 5.0 g/dL   AST 17 15 - 41 U/L   ALT 15 0 - 44 U/L   Alkaline Phosphatase 35 (L) 38 - 126 U/L   Total Bilirubin 0.5 <1.2 mg/dL   GFR, Estimated >65 >78 mL/min   Anion gap 8 5 - 15  Wet prep, genital  Status: None   Collection Time:  11/13/23  3:13 PM  Result Value Ref Range   Yeast Wet Prep HPF POC NONE SEEN NONE SEEN   Trich, Wet Prep NONE SEEN NONE SEEN   Clue Cells Wet Prep HPF POC NONE SEEN NONE SEEN   WBC, Wet Prep HPF POC <10 <10   Sperm NONE SEEN    US OB Comp Less 14 Wks  Result Date: 11/13/2023 CLINICAL DATA:  32 year old with abdominal pain. [id number] Pregnancy of unknown anatomic location [id number] EXAM: OBSTETRIC <14 WK ULTRASOUND TECHNIQUE: Transabdominal ultrasound was performed for evaluation of the gestation as well as the maternal uterus and adnexal regions. COMPARISON:  01/31/2022 FINDINGS: Intrauterine gestational sac: Single Yolk sac:  Visualized. Embryo:  Visualized. Cardiac Activity: Visualized. Heart Rate: 171 bpm CRL:   21.4 mm   8 w 5 d                  [place] EDC: 06/19/2024 Subchorionic hemorrhage:  None visualized. Maternal uterus/adnexae: Right ovary is not visualized. Left ovary measures 3.3 x 2.7 x 2.9 cm. IMPRESSION: Single live intrauterine pregnancy. Calculated gestational age is 8 weeks and 5 days. Electronically Signed   By: Richarda Overlie M.D.   On: 11/13/2023 16:58    MDM PE Labs: [place] workup, CMP,  EFM  Assessment and Plan  32yo  G1P0  SIUP at 8.6weeks   - Exam findings discussed. SIUP on [place], reassuring lab work. - OB list given, counseled that if she desires to give birth here, she will need to establish care with a practice with privileges here.  - Safe medication list provided.  - Strict bleeding and infection precautions counseled. - Return to MAU PRN. - Discharged in stable condition.    Richardson Landry MSN, CNM 11/13/2023, 5:48 PM

## 2023-11-13 NOTE — MAU Note (Signed)
.  Joann Rivera is a 32 y.o. at [redacted]w[redacted]d here in MAU reporting: Lower abdominal cramping as well as discolored discharge since last night. She reports this morning her cramping has continued and her discharge is a brownish color. She reports when her pain occurs it is initially sharp and then turns into more of a menstrual cramp. She reports while at work today her pain became unbearable. 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 Denies recent IC. Denies vaginal itching and vaginal odors. Has not had an [place].   Has not taken anything for the pain.  Onset of complaint: Last night Pain score: 4/10 lower abdomen  Vitals:   11/13/23 1435  BP: 118/67  Pulse: 91  Resp: 14  Temp: 99.1 F (37.3 C)  SpO2: 100%     FHT: n/a Lab orders placed from triage: UA

## 2023-11-13 NOTE — Discharge Instructions (Addendum)
[place] for [employer] at [facility]             [address] [place], [place] [address] [place] for [employer] at [place], [address], [place], [place], [address] [phone]  Center for [facility] at [place], [place], [place], [place], [address] [phone]  Center for [facility] at [facility] [address] [place], [place], [place], [address] [phone]  Center for [place] at [facility]                                 [place], [place], [place], [address] [phone]  Center for [place] at [facility]                                    [address] [place], [place], [address] [place] for [place] at [facility] [address] [place], [place], [place], [address]                              [facility] [place], [place], [place], [place], [address] [phone]  [facility]         Phone: [phone]  [facility]      Phone: [facility]      Phone: [place] [facility]         Phone: [phone]   [employer] [facility]-M[facility]    Phone: [place]      Phone: [phone]  [facility]     Phone: [phone]  [facility]        Phone: [phone]  [facility] ([place]) [phone]  [facility]      Phone: [phone]                        Safe Medications in Pregnancy    Acne: Benzoyl Peroxide Salicylic Acid  Backache/Headache: Tylenol: 2 regular strength every 4 hours OR               2 Extra strength every 6 hours  Colds/Coughs/Allergies: Benadryl (alcohol free) 25 mg every 6 hours as needed Breath right strips Claritin Cepacol throat lozenges Chloraseptic throat spray Cold-Eeze- up to three times per day Cough drops, alcohol free Flonase (by prescription only) Guaifenesin Mucinex Robitussin DM (plain only, alcohol free) Saline nasal spray/drops Sudafed (pseudoephedrine) & Actifed ** use only after [redacted] weeks gestation and if you do not have high blood pressure Tylenol Vicks Vaporub  Zinc lozenges Zyrtec   Constipation: Colace Ducolax suppositories Fleet enema Glycerin suppositories Metamucil Milk of magnesia Miralax Senokot Smooth move tea  Diarrhea: Kaopectate Imodium A-D  *NO pepto Bismol  Hemorrhoids: Anusol Anusol HC Preparation H Tucks  Indigestion: Tums Maalox Mylanta Zantac  Pepcid  Insomnia: Benadryl (alcohol free) '25mg'$  every 6 hours as needed Tylenol PM Unisom, no Gelcaps  Leg Cramps: Tums MagGel  Nausea/Vomiting:  Bonine Dramamine Emetrol Ginger extract Sea bands Meclizine  Nausea medication to take during pregnancy:  Unisom (doxylamine succinate 25 mg tablets) Take one tablet daily at bedtime. If symptoms are not adequately controlled, the dose can be increased to a maximum recommended dose of two tablets daily (1/2 tablet in the morning, 1/2 tablet mid-afternoon and one at bedtime). Vitamin B6 '100mg'$  tablets. Take one tablet twice a day (up to 200 mg per day).  Skin Rashes: Aveeno products Benadryl cream or '25mg'$  every 6 hours as needed Calamine Lotion 1% cortisone cream  Yeast infection: Gyne-lotrimin 7 Monistat 7   **If taking multiple medications, please check labels to avoid duplicating the same active ingredients **take medication as directed on the label ** Do not exceed 4000 mg of tylenol in 24 hours **Do not take medications that contain aspirin or ibuprofen

## 2023-11-14 LAB — ABO/RH: ABO/RH(D): A POS

## 2023-11-15 LAB — GC/CHLAMYDIA PROBE AMP (~~LOC~~) NOT AT ARMC
Chlamydia: NEGATIVE
Comment: NEGATIVE
Comment: NORMAL
Neisseria Gonorrhea: NEGATIVE

## 2024-10-15 ENCOUNTER — Encounter (HOSPITAL_BASED_OUTPATIENT_CLINIC_OR_DEPARTMENT_OTHER): Payer: Self-pay | Admitting: Emergency Medicine

## 2024-10-15 ENCOUNTER — Emergency Department (HOSPITAL_BASED_OUTPATIENT_CLINIC_OR_DEPARTMENT_OTHER)
Admission: EM | Admit: 2024-10-15 | Discharge: 2024-10-15 | Disposition: A | Discharge status: Home / Self Care | Attending: Emergency Medicine | Admitting: Emergency Medicine

## 2024-10-15 ENCOUNTER — Other Ambulatory Visit: Payer: Self-pay

## 2024-10-15 ENCOUNTER — Emergency Department (HOSPITAL_BASED_OUTPATIENT_CLINIC_OR_DEPARTMENT_OTHER)

## 2024-10-15 DIAGNOSIS — R1031 Right lower quadrant pain: Secondary | ICD-10-CM | POA: Diagnosis present

## 2024-10-15 LAB — CBC
HCT: 37.3 % (ref 36.0–46.0)
Hemoglobin: 12.7 g/dL (ref 12.0–15.0)
MCH: 28.9 pg (ref 26.0–34.0)
MCHC: 34 g/dL (ref 30.0–36.0)
MCV: 85 fL (ref 80.0–100.0)
Platelets: 308 K/uL (ref 150–400)
RBC: 4.39 MIL/uL (ref 3.87–5.11)
RDW: 14 % (ref 11.5–15.5)
WBC: 5.9 K/uL (ref 4.0–10.5)
nRBC: 0 % (ref 0.0–0.2)

## 2024-10-15 LAB — COMPREHENSIVE METABOLIC PANEL WITH GFR
ALT: 16 U/L (ref 0–44)
AST: 23 U/L (ref 15–41)
Albumin: 4.7 g/dL (ref 3.5–5.0)
Alkaline Phosphatase: 74 U/L (ref 38–126)
Anion gap: 15 (ref 5–15)
BUN: 17 mg/dL (ref 6–20)
CO2: 22 mmol/L (ref 22–32)
Calcium: 9.5 mg/dL (ref 8.9–10.3)
Chloride: 100 mmol/L (ref 98–111)
Creatinine, Ser: 0.78 mg/dL (ref 0.44–1.00)
GFR, Estimated: >60 mL/min (ref >60)
Glucose, Bld: 87 mg/dL (ref 70–99)
Potassium: 3.8 mmol/L (ref 3.5–5.1)
Sodium: 137 mmol/L (ref 135–145)
Total Bilirubin: 0.5 mg/dL (ref 0.0–1.2)
Total Protein: 8.3 g/dL — ABNORMAL HIGH (ref 6.5–8.1)

## 2024-10-15 LAB — URINALYSIS, ROUTINE W REFLEX MICROSCOPIC
Bilirubin Urine: NEGATIVE
Glucose, UA: NEGATIVE mg/dL
Hgb urine dipstick: NEGATIVE
Ketones, ur: NEGATIVE mg/dL
Leukocytes,Ua: NEGATIVE
Nitrite: NEGATIVE
Protein, ur: NEGATIVE mg/dL
Specific Gravity, Urine: 1.025 (ref 1.005–1.030)
pH: 5.5 (ref 5.0–8.0)

## 2024-10-15 LAB — LIPASE, BLOOD: Lipase: 29 U/L (ref 11–51)

## 2024-10-15 LAB — PREGNANCY, URINE: Preg Test, Ur: NEGATIVE

## 2024-10-15 MED ORDER — IOHEXOL 300 MG/ML  SOLN
100.0000 mL | Freq: Once | INTRAMUSCULAR | Status: AC | PRN
  Administered 2024-10-15: 100 mL via INTRAVENOUS

## 2024-10-15 MED ORDER — SODIUM CHLORIDE 0.9 % IV BOLUS
1000.0000 mL | Freq: Once | INTRAVENOUS | Status: AC
  Administered 2024-10-15: 1000 mL via INTRAVENOUS

## 2024-10-15 NOTE — ED Provider Notes (Signed)
 [facility] EMERGENCY DEPARTMENT AT [facility] Provider Note   CSN: [id number] Arrival date & time: 10/15/24  1448     Patient presents with: Abdominal Pain   Joann Rivera is a 33 y.o. female here with abdominal pain. Patient is 4 months s/p C section and has RLQ pain for 4 days. Patient denies any fever or chills. States that she is constipated. She is still breast feeding.    The history is provided by the patient.       Prior to Admission medications   Medication Sig Start Date End Date Taking? Authorizing Provider  amoxicillin -clavulanate (AUGMENTIN ) 875-125 MG tablet Take 1 tablet by mouth every 12 (twelve) hours. 04/17/19   Dreama Longs, MD  loratadine (CLARITIN) 10 MG tablet Take 10 mg by mouth daily.    [provider]  medroxyPROGESTERone  (PROVERA ) 10 MG tablet Take 1 tablet (10 mg total) by mouth daily. Use for ten days 12/04/17   Stinson, Jacob J, DO  metroNIDAZOLE  (FLAGYL ) 500 MG tablet Take 1 tablet (500 mg total) by mouth 2 (two) times daily. 12/12/17   Stinson, Jacob J, DO  Prenatal Vit-Fe Fumarate-FA (PRENATAL MULTIVITAMIN) TABS tablet Take 1 tablet by mouth daily at 12 noon.    [provider]    Allergies: Patient has no known allergies.    Review of Systems  Gastrointestinal:  Positive for abdominal pain.  All other systems reviewed and are negative.   Updated Vital Signs BP 111/78   Pulse 75   Temp 98.2 F (36.8 C) (Oral)   Resp 17   SpO2 100%   Physical Exam Vitals and nursing note reviewed.  Constitutional:      Appearance: She is well-developed.  HENT:     Head: Normocephalic.     Mouth/Throat:     Mouth: Mucous membranes are moist.  Eyes:     Extraocular Movements: Extraocular movements intact.     Pupils: Pupils are equal, round, and reactive to light.  Cardiovascular:     Rate and Rhythm: Normal rate and regular rhythm.  Abdominal:     Comments: Mild RLQ tenderness, C section scar healing well   Skin:     General: Skin is warm.  Neurological:     General: No focal deficit present.     Mental Status: She is alert and oriented to person, place, and time.  Psychiatric:        Mood and Affect: Mood normal.        Behavior: Behavior normal.     (all labs ordered are listed, but only abnormal results are displayed) Labs Reviewed  COMPREHENSIVE METABOLIC PANEL WITH GFR - Abnormal; Notable for the following components:      Result Value   Total Protein 8.3 (*)    All other components within normal limits  LIPASE, BLOOD  CBC  URINALYSIS, ROUTINE W REFLEX MICROSCOPIC  PREGNANCY, URINE    EKG: None  Radiology: No results found.   Procedures   Medications Ordered in the ED  sodium chloride 0.9 % bolus 1,000 mL (has no administration in time range)                                    Medical Decision Making [name] is a 32 y.o. female here with RLQ pain. Consider appendicitis vs colitis vs diverticulitis vs pain from C section site. Plan to get cbc, cmp, UA,  CT ab/pel.   6:22 PM I reviewed patient's labs and independently interpreted CT scan. Labs and UA and CT ab/pel and they were unremarkable. Stable for discharge   Problems Addressed: Right lower quadrant abdominal pain: acute illness or injury  Amount and/or Complexity of Data Reviewed Labs: ordered. Radiology: ordered.  Risk Prescription drug management.     Final diagnoses:  None    ED Discharge Orders     None          Patt Alm Macho, MD 10/15/24 [phone]

## 2024-10-15 NOTE — ED Triage Notes (Signed)
 Pt presents from UC for CT scan for RLQ pain x 4 days. No fever/chills. Some constipation.  Pt is breast feeding.

## 2024-10-15 NOTE — Discharge Instructions (Signed)
 Your CT scans and labs are normal today  Take tylenol and motrin for pain   See your doctor   Return to ER if you have worse abdominal pain, vomiting, fever
